# Patient Record
Sex: Female | Born: 1970 | Race: White | Hispanic: No | Marital: Married
Health system: Southern US, Community
[De-identification: ages and names within clinical notes are randomized; demographics above are authoritative.]

---

## 2012-05-15 ENCOUNTER — Emergency Department: Payer: Self-pay | Admitting: Unknown Physician Specialty

## 2013-04-09 LAB — URINALYSIS, COMPLETE
Bilirubin,UR: NEGATIVE
Blood: NEGATIVE
Glucose,UR: NEGATIVE mg/dL (ref 0–75)
Ketone: NEGATIVE
Nitrite: NEGATIVE
Ph: 6 (ref 4.5–8.0)
Protein: NEGATIVE
Specific Gravity: 1.01 (ref 1.003–1.030)
Squamous Epithelial: 3

## 2013-04-09 LAB — DRUG SCREEN, URINE
Amphetamines, Ur Screen: NEGATIVE (ref ?–1000)
Barbiturates, Ur Screen: NEGATIVE (ref ?–200)
Benzodiazepine, Ur Scrn: NEGATIVE (ref ?–200)
Cannabinoid 50 Ng, Ur ~~LOC~~: NEGATIVE (ref ?–50)
Cocaine Metabolite,Ur ~~LOC~~: NEGATIVE (ref ?–300)
Opiate, Ur Screen: NEGATIVE (ref ?–300)
Phencyclidine (PCP) Ur S: NEGATIVE (ref ?–25)

## 2013-04-10 ENCOUNTER — Inpatient Hospital Stay: Payer: Self-pay | Admitting: Internal Medicine

## 2013-04-10 DIAGNOSIS — I6789 Other cerebrovascular disease: Secondary | ICD-10-CM

## 2013-04-10 LAB — CBC
HCT: 39.8 % (ref 35.0–47.0)
HGB: 13.5 g/dL (ref 12.0–16.0)
MCH: 30.7 pg (ref 26.0–34.0)
MCHC: 34 g/dL (ref 32.0–36.0)
MCV: 90 fL (ref 80–100)
Platelet: 266 10*3/uL (ref 150–440)
WBC: 10.4 10*3/uL (ref 3.6–11.0)

## 2013-04-10 LAB — COMPREHENSIVE METABOLIC PANEL
Albumin: 3.9 g/dL (ref 3.4–5.0)
Alkaline Phosphatase: 131 U/L (ref 50–136)
BUN: 10 mg/dL (ref 7–18)
Co2: 27 mmol/L (ref 21–32)
Creatinine: 1.23 mg/dL (ref 0.60–1.30)
EGFR (Non-African Amer.): 55 — ABNORMAL LOW
Glucose: 99 mg/dL (ref 65–99)
Osmolality: 278 (ref 275–301)
Potassium: 3.9 mmol/L (ref 3.5–5.1)
SGOT(AST): 20 U/L (ref 15–37)
Total Protein: 7.5 g/dL (ref 6.4–8.2)

## 2013-04-10 LAB — PROTIME-INR: Prothrombin Time: 29.8 secs — ABNORMAL HIGH (ref 11.5–14.7)

## 2013-04-10 LAB — HCG, QUANTITATIVE, PREGNANCY: Beta Hcg, Quant.: <1 m[IU]/mL — ABNORMAL LOW

## 2013-04-10 LAB — TROPONIN I: Troponin-I: <0.02 ng/mL

## 2013-04-11 LAB — BASIC METABOLIC PANEL
BUN: 16 mg/dL (ref 7–18)
Calcium, Total: 8.9 mg/dL (ref 8.5–10.1)
Co2: 31 mmol/L (ref 21–32)
EGFR (Non-African Amer.): >60
Glucose: 101 mg/dL — ABNORMAL HIGH (ref 65–99)
Osmolality: 275 (ref 275–301)
Potassium: 3.8 mmol/L (ref 3.5–5.1)
Sodium: 137 mmol/L (ref 136–145)

## 2013-04-11 LAB — CBC WITH DIFFERENTIAL/PLATELET
Eosinophil %: 2.3 %
HGB: 12.2 g/dL (ref 12.0–16.0)
Lymphocyte #: 2.8 10*3/uL (ref 1.0–3.6)
MCH: 30.4 pg (ref 26.0–34.0)
MCHC: 33.7 g/dL (ref 32.0–36.0)
MCV: 90 fL (ref 80–100)
Monocyte %: 5.3 %
Platelet: 249 10*3/uL (ref 150–440)
RBC: 4.02 10*6/uL (ref 3.80–5.20)
RDW: 13.8 % (ref 11.5–14.5)

## 2013-04-11 LAB — LIPID PANEL
Cholesterol: 161 mg/dL (ref 0–200)
HDL Cholesterol: 43 mg/dL (ref 40–60)
Ldl Cholesterol, Calc: 98 mg/dL (ref 0–100)

## 2014-02-12 ENCOUNTER — Emergency Department: Payer: Self-pay | Admitting: Emergency Medicine

## 2014-10-26 ENCOUNTER — Ambulatory Visit: Payer: Self-pay | Admitting: Family Medicine

## 2015-01-12 NOTE — Discharge Summary (Signed)
PATIENT NAME:  [name], [name] DATE OF BIRTH:  1971-03-03  DATE OF ADMISSION:  04/10/2013 DATE OF DISCHARGE:  04/11/2013  ADMITTING PHYSICIAN: Starleen Armsawood S. Elgergawy, MD  DISCHARGING PHYSICIAN: Enid Baasadhika Jackqueline Aquilar, MD  PRIMARY CARE PHYSICIAN: None.   CONSULTATIONS IN THE HOSPITAL: None.   DISCHARGE DIAGNOSES: 1.  Left arm tingling, numbness: Could be radiculopathy versus transient ischemic attack, however, resolved.  2.  Hypertension on admission: Resolved. 3.  Tobacco use disorder.  4.  Obesity.   DISCHARGE HOME MEDICATIONS: None. The patient can take aspirin if tolerated, but SHE IS ALLERGIC TO NSAIDS.  DISCHARGE DIET: Low-sodium, low-fat diet.   DISCHARGE ACTIVITY: As tolerated.    FOLLOWUP INSTRUCTIONS: Lifestyle modifications recommended with weight reduction, smoking cessation, low-fat diet and increasing physical activity. Followup with PCP recommended in few weeks.   LABORATORY AND IMAGING STUDIES: Prior to discharge: WBC 7.8, hemoglobin 12.2, hematocrit 36.3, platelet count 249.   Sodium 137, potassium 3.8, chloride 104, bicarbonate 31, BUN 16, creatinine 0.87, glucose 101 and calcium of 8.9. LDL cholesterol 98, HDL 43, total cholesterol [id number], triglycerides 100. INR 1.1.   Ultrasound Doppler of carotids showing no hemodynamically significant carotid artery stenosis.   Echo Doppler showing no source of CVA, normal LV ejection fraction, EF of 55% to 60%. Global LV systolic function is normal. Normal right ventricular pressures.   Urinalysis negative for any infection. Urine tox screen was negative on admission.   BRIEF HOSPITAL COURSE: [name] is a 44 year old, morbidly obese Caucasian female with no significant past medical history other than smoking, presents to the hospital secondary to left hand tingling and numbness. Her symptoms were more in the shoulder area. However, because of family history of stroke, her smoking, since  her blood pressure was elevated  on admission, she was admitted for possible TIA rule out.  1.  Left arm tingling and numbness persisted less than 2 hours. She was on Plavix while in the hospital due to her NSAID ALLERGY. However, symptoms resolved. MRI was completely negative. She had normal blood pressure throughout the hospital course, not requiring any medications, and she refused to take any Plavix at discharge at this time. Outpatient followup with PCP is recommended if her symptoms recur.  2.  Hypertension: She had elevated blood pressure on admission. However, her blood pressure throughout the hospital course has been stable without use of any medications, so she is not being discharged on medications at home at this time. The patient was on HCTZ, of note, in the past at home.  3.  Tobacco use disorder: She was strongly counseled against smoking. 4.  Also for her obesity, lifestyle modification changes were advised to the patient including weight reduction, increasing physical activity, changing her diet.  Her course has been otherwise uneventful in the hospital.   DISCHARGE CONDITION: Stable.   DISCHARGE DISPOSITION: Home.   TIME SPENT ON DISCHARGE: 40 minutes.   ____________________________ Enid Baasadhika Titilayo Hagans, MD [id number] D: 04/11/2013 14:45:23 ET T: 04/11/2013 20:15:53 ET JOB#: [id number]  cc: Enid Baasadhika Florestine Carmical, MD, <Dictator> Enid BaasADHIKA Divon Krabill MD ELECTRONICALLY SIGNED 04/25/2013 15:14

## 2015-01-12 NOTE — H&P (Signed)
PATIENT NAME:  [name], [name] MR#:  [id number] DATE OF BIRTH:  1971-01-02  DATE OF ADMISSION:  04/10/2013  REFERRING PHYSICIAN:  Dr. Lurline Hare.  PRIMARY CARE PHYSICIAN:  None.   CHIEF COMPLAINT:  Left upper extremity weakness and numbness.   HISTORY OF PRESENT ILLNESS:  This is a 44 year old female with significant past medical history of hypertension, not taking any medication for the last two years, as she moved in 2012 from [place], [place] and did not see any physician here since, the patient reports she started to have left upper extremity tingling and numbness this morning, and reports around 7:30 or 8:00 p.m. she developed left upper extremity weakness, she denies any confusion, altered mental status, any dysarthria or any left lower extremity weakness, as well she had some mild complaints of headache, the patient had CT of the brain done in the ED which did not show any acute findings, ED physician discussed the case with [facility] neurology who recommended TPA, but the patient declined, as well the patient was found to be hypertensive by EMS with blood pressure 190/120, which improved gradually without any intervention, as well the patient was found to have elevated INR at 2.9, she denies taking any blood thinner or having any liver disease, hospitalist service was requested to admit the patient for further work-up of her left upper extremity weakness.   PAST MEDICAL HISTORY:  Hypertension.   PAST SURGICAL HISTORY:  None.   ALLERGIES:  IBUPROFEN, REPORTEDLY CAUSED HER HIVES AND SWELLING.   SOCIAL HISTORY:  The patient is on SSI, she smokes 1 pack per day, denies any alcohol or illicit drug use.   FAMILY HISTORY:  Significant for CVA in her father, significant for diabetes mellitus, hypertension and COPD in her mother.   REVIEW OF SYSTEMS:  CONSTITUTIONAL:  Denies fevers, chills, weight gain, weight loss.  EYES:  Denies blurry vision, double vision, inflammation, glaucoma.  EARS, NOSE,  THROAT:  Denies tinnitus, ear pain, epistaxis, discharge.  RESPIRATORY:  Denies cough, wheezing, hemoptysis or COPD.  CARDIOVASCULAR:  Denies chest pain, orthopnea, edema, arrhythmia, palpitation.  GASTROINTESTINAL:  Denies nausea, vomiting, diarrhea, abdominal pain, hematemesis, melena.  GENITOURINARY:  Denies dysuria, hematuria, renal colic.  ENDOCRINE:  Denies polyuria, polydipsia, heat or cold intolerance.  HEMATOLOGY:  Denies anemia, easy bruising, bleeding diathesis.  INTEGUMENTARY:  Denies acne, rash or skin lesions.  MUSCULOSKELETAL:  Denies neck pain, arthritis, cramps or gout.  NEUROLOGIC:  Has complaints of left upper extremity numbness and weakness, denies any previous history of CVA, seizures, denies any dysarthria, had complaints of headache.  PSYCHIATRIC:  Reports some mild anxiety.  Denies depression, bipolar disorder.   PHYSICAL EXAMINATION: VITAL SIGNS:  Temperature 98, pulse 74, respiratory rate 20, blood pressure 119/64, saturating 96% on room air.  GENERAL:  Morbidly obese female looks comfortable in bed in no apparent distress.  HEENT:  Head atraumatic, normocephalic.  Pupils equal, reactive to light.  Pink conjunctivae.  Anicteric sclerae.  Moist oral mucosa.  NECK:  Supple.  No thyromegaly.  No JVD.  CHEST:  Good air entry bilaterally.  No wheezing, rales, rhonchi.  CARDIOVASCULAR:  S1 and S2 heard.  No rubs, murmurs, gallops.  ABDOMEN:  Obese, soft, nontender, nondistended.  Bowel sounds present.  EXTREMITIES:  No edema.  No clubbing.  No cyanosis.  Pedal pulses felt bilaterally.  PSYCHIATRIC:  Appropriate affect.  Awake, alert x 3.  Intact judgment and insight.  NEUROLOGICAL:  Cranial nerves grossly intact.  No deficits.  Motor  exam of the right upper, right lower, left lower 5 out of 5, sensation is intact to light touch and symmetrical in lower extremity.  On the left upper extremity, was inconsistent, at one point appears to have some weakness with motor 3 to 4 out  of 5, but when the patient is asked to try to put full effort in it, it appears to be improving.  SKIN:  Normal skin turgor.  Warm and dry.  Had a few tattoos.   PERTINENT LABORATORY DATA:  Glucose 99, BUN 10, creatinine 1.23, sodium 140, potassium 3.9, chloride 105, CO2 27.  Troponin less than 0.02.  ALT 31, AST 20, alk phos 131, total protein 7.5, total albumin 3.9.  Urinalysis negative for leukocyte esterase and nitrites.  Drugs of abuse negative.  White blood cells 10.4, hemoglobin 15.5, hematocrit 39.8, platelets 266.  INR 2.9, PT 29.8.   IMAGING STUDIES:  CT head without contrast has no acute intracranial process.   ASSESSMENT AND PLAN: 1.  Left upper extremity weakness, it appears to be inconsistent at the physical exam ,and imroving  when patient is asking to put full effort, a CT brain does not have any acute finding, we will proceed with cerebrovascular accident work-up.  We will order MRI brain, 2-D echocardiogram, ultrasound carotid Dopplers, and we will have the patient admitted to telemetry unit with telemetry monitor, she reports ALLERGIES TO NSAID so she will be given 150 mg of Plavix currently and it depends on her work-up, we will decide on resuming the Plavix or not.  We will check lipid panel and if elevated we will start her on statin, we will consult neurology service.  We will have PT consulted as well.  2.  Elevated INR, etiology is unclear, we will recheck the level, the patient denies any liver disease or taking any blood thinners.  3.  Tobacco abuse.  The patient was counseled for three minutes and she will be started on NicoDerm patches.  4.  Hypertension, initially uncontrolled, currently improved, we will resume hydrochlorothiazide when the patient is more stable, if the patient had received a diagnosis would allow for permissive hypertension initially and we will hold all blood pressure meds currently.  5.  DVT prophylaxis, we will have the patient on sequential compression  device and Ted hose, we will hold on any further anticoagulation given the patient's elevated INR at 2.9.  6.  CODE STATUS:  FULL CODE.   Total time spent on admission and patient care 55 minutes.      ____________________________ Albertine Patricia, MD dse:ea D: 04/10/2013 03:44:05 ET T: 04/10/2013 04:37:44 ET JOB#: [id number]  cc: Albertine Patricia, MD, <Dictator> Tynleigh Birt Graciela Husbands MD ELECTRONICALLY SIGNED 04/10/2013 6:16

## 2015-01-22 IMAGING — CR DG KNEE COMPLETE 4+V*L*
1 series · 5 of 5 positions shown · non-contrast
Comparison: None.

CLINICAL DATA: Left knee pain after falling onto knee.

EXAM:
LEFT KNEE - COMPLETE 4+ VIEW

[Series 1: t knee ap left · 0.14mm/px · 5 of 5 slices shown]
[im 1/5]
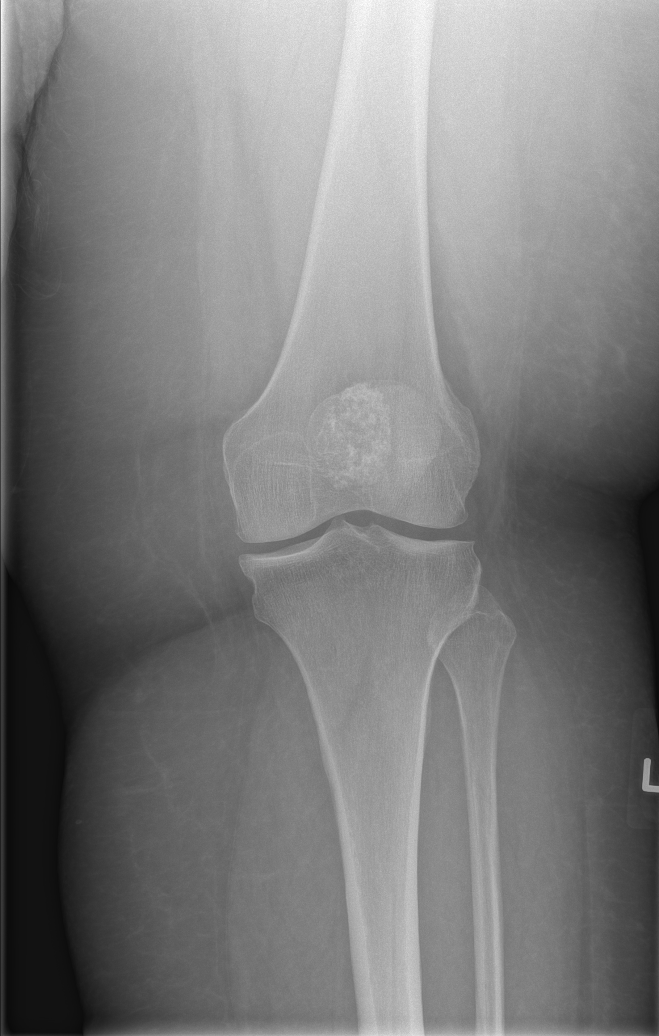
[im 2/5]
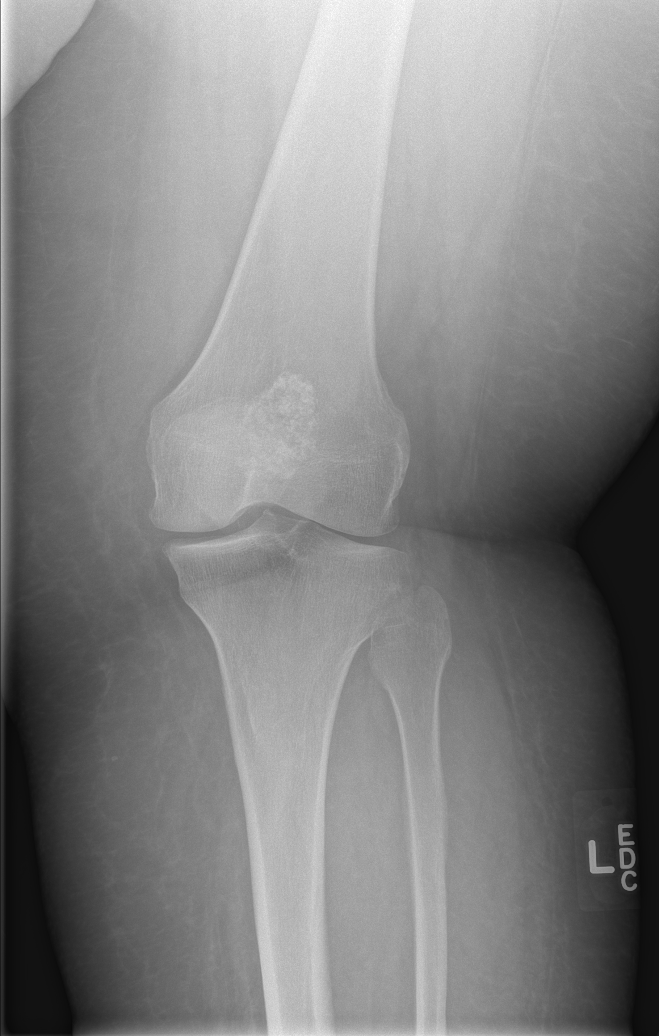
[im 3/5]
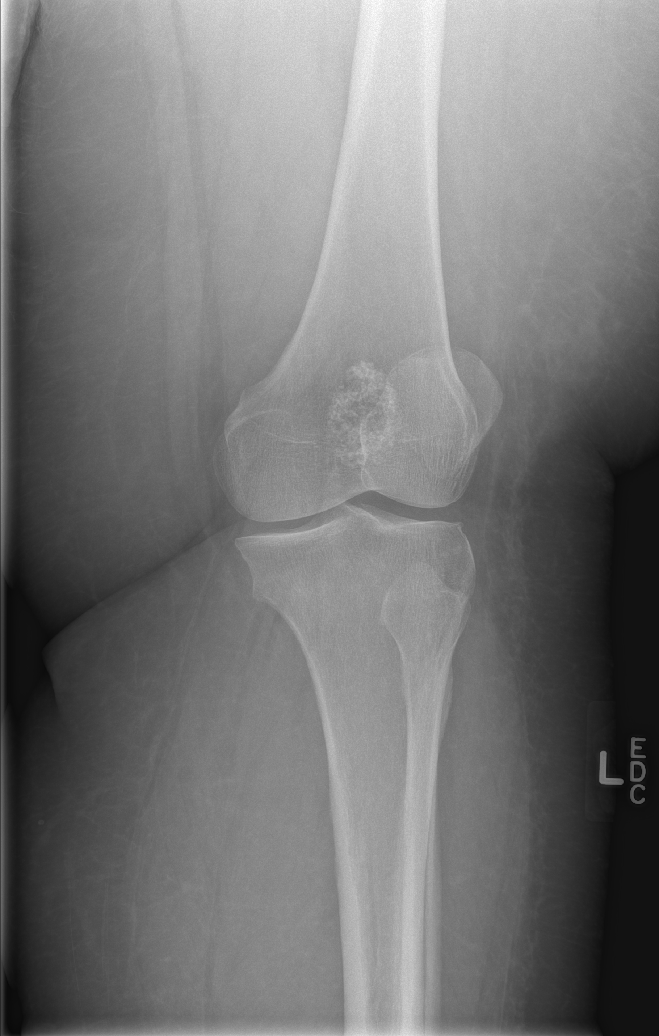
[im 4/5]
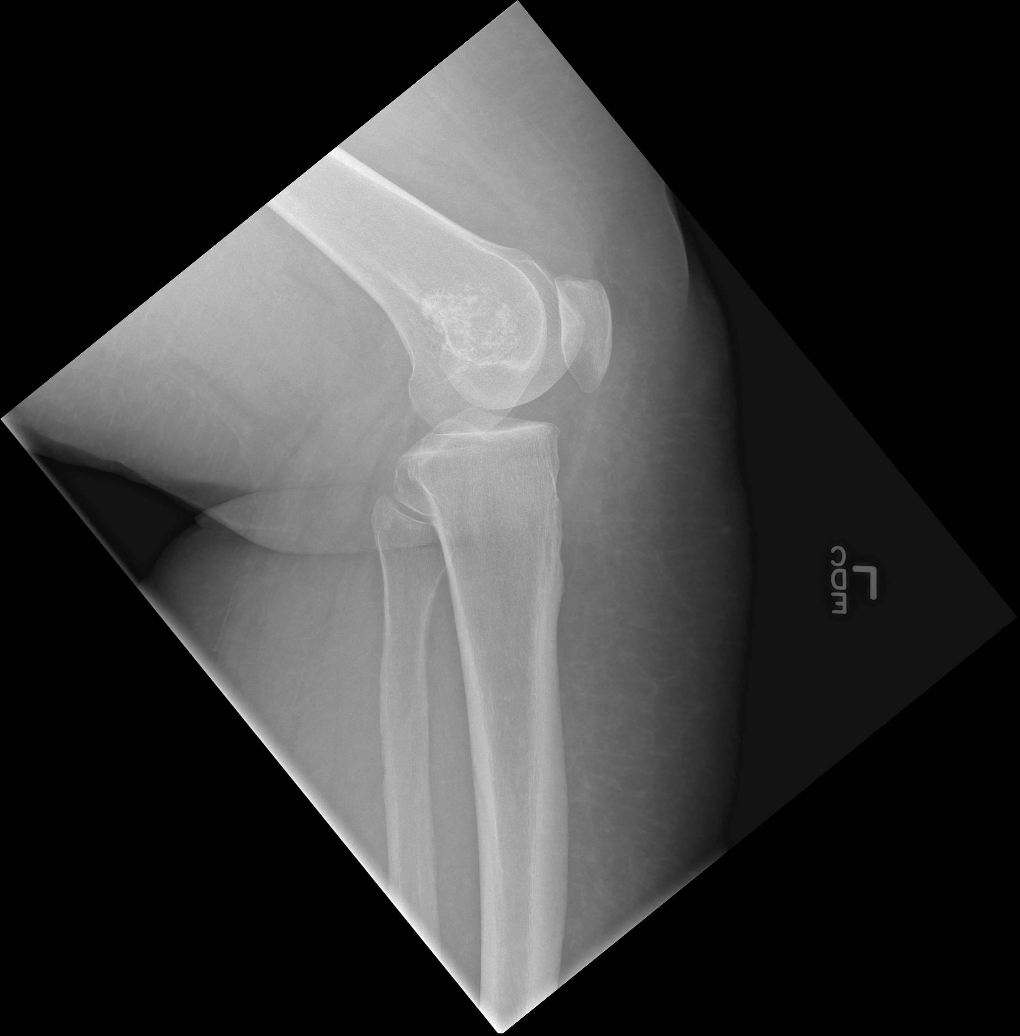
[im 5/5]
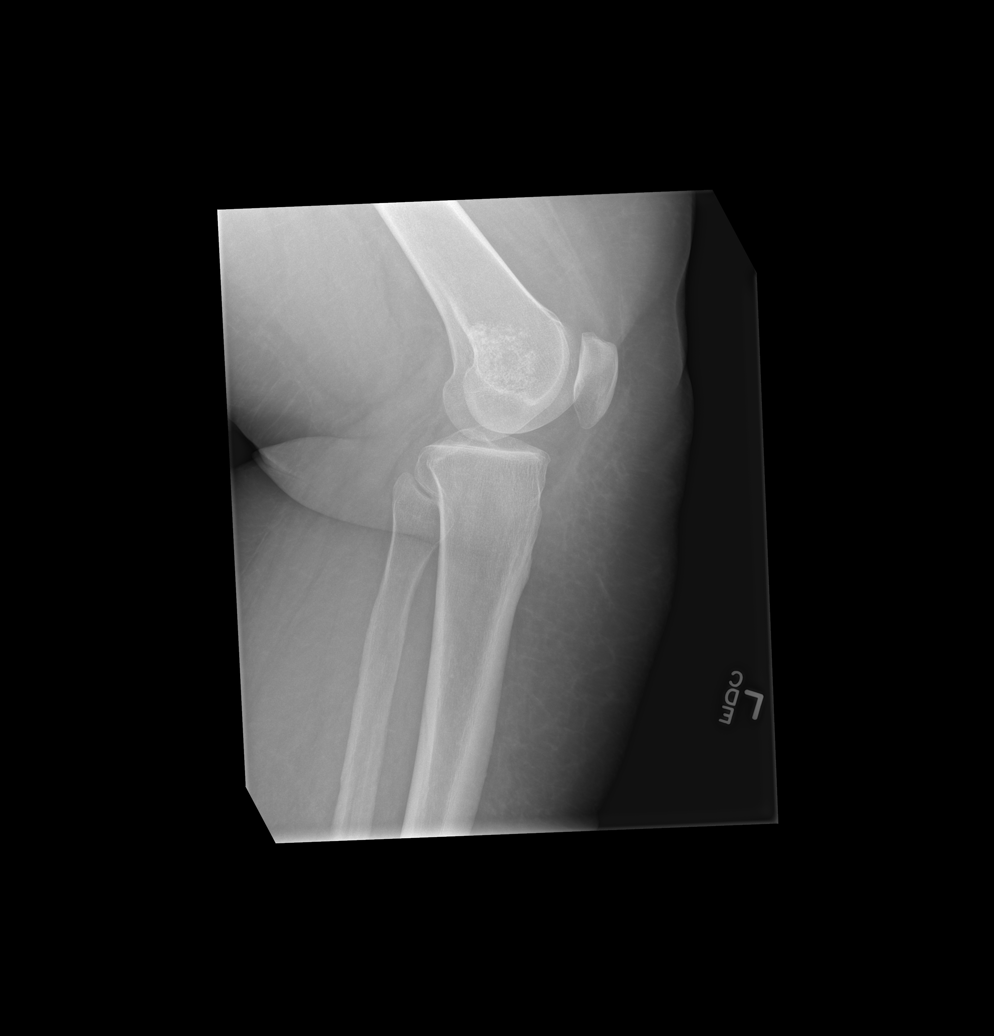

[5 of 5 positions shown; findings below may reference images not displayed]

FINDINGS: There is no evidence of fracture or dislocation. The joint spaces
are preserved. No significant degenerative change is seen; the
patellofemoral joint is grossly unremarkable in appearance. An
apparent bone infarct is noted within the distal femur.

No significant joint effusion is seen. The visualized soft tissues
are normal in appearance.
IMPRESSION: No evidence of fracture or dislocation.

## 2016-02-05 ENCOUNTER — Ambulatory Visit: Payer: Medicaid Other | Admitting: Physical Therapy

## 2016-02-26 ENCOUNTER — Ambulatory Visit: Payer: Medicaid Other | Attending: Family Medicine | Admitting: Physical Therapy

## 2016-03-03 ENCOUNTER — Ambulatory Visit: Payer: Medicaid Other

## 2016-03-11 ENCOUNTER — Ambulatory Visit: Payer: Medicaid Other
